# Patient Record
Sex: Male | Born: 1990 | Race: Black or African American | Hispanic: No | Marital: Single
Health system: Southern US, Community
[De-identification: ages and names within clinical notes are randomized; demographics above are authoritative.]

---

## 2018-11-10 ENCOUNTER — Other Ambulatory Visit: Payer: Self-pay

## 2018-11-10 ENCOUNTER — Emergency Department (HOSPITAL_COMMUNITY)
Admission: EM | Admit: 2018-11-10 | Discharge: 2018-11-10 | Disposition: A | Discharge status: Home / Self Care | Payer: No Typology Code available for payment source | Attending: Emergency Medicine | Admitting: Emergency Medicine

## 2018-11-10 ENCOUNTER — Emergency Department (HOSPITAL_COMMUNITY): Payer: No Typology Code available for payment source

## 2018-11-10 DIAGNOSIS — Y999 Unspecified external cause status: Secondary | ICD-10-CM | POA: Insufficient documentation

## 2018-11-10 DIAGNOSIS — R0781 Pleurodynia: Secondary | ICD-10-CM | POA: Diagnosis not present

## 2018-11-10 DIAGNOSIS — S50311A Abrasion of right elbow, initial encounter: Secondary | ICD-10-CM | POA: Diagnosis not present

## 2018-11-10 DIAGNOSIS — Y92414 Local residential or business street as the place of occurrence of the external cause: Secondary | ICD-10-CM | POA: Diagnosis not present

## 2018-11-10 DIAGNOSIS — Y9389 Activity, other specified: Secondary | ICD-10-CM | POA: Diagnosis not present

## 2018-11-10 NOTE — ED Notes (Signed)
Patient transported to x-ray. ?

## 2018-11-10 NOTE — Discharge Instructions (Signed)
Can take tylenol or motrin for pain. Close follow-up with your primary care doctor. Return here for any new/acute changes.

## 2018-11-10 NOTE — ED Notes (Signed)
Verbalized discharge instructions to patient; opportunity for questions and concerns allowed with none voiced. Patient ambulatory and stable at time of discharge. Unable to obtain signature for discharge because no topaz pad.

## 2018-11-10 NOTE — ED Notes (Signed)
Patient ambulated to restroom with steady gait and no assistance. Patient provided items to wash up and clean clothes. Patient denies any pain currently. Will continue to monitor patient.
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
